# Patient Record
Sex: Female | Born: 1990 | Hispanic: Yes | Marital: Married | Smoking: Never smoker
Health system: Southern US, Community
[De-identification: ages and names within clinical notes are randomized; demographics above are authoritative.]

---

## 2020-05-10 ENCOUNTER — Emergency Department (HOSPITAL_COMMUNITY): Payer: Self-pay

## 2020-05-10 ENCOUNTER — Other Ambulatory Visit: Payer: Self-pay

## 2020-05-10 ENCOUNTER — Encounter (HOSPITAL_COMMUNITY): Payer: Self-pay | Admitting: Emergency Medicine

## 2020-05-10 ENCOUNTER — Emergency Department (HOSPITAL_COMMUNITY)
Admission: EM | Admit: 2020-05-10 | Discharge: 2020-05-10 | Discharge status: Against Medical Advice | Payer: Self-pay | Attending: Emergency Medicine | Admitting: Emergency Medicine

## 2020-05-10 DIAGNOSIS — Z5321 Procedure and treatment not carried out due to patient leaving prior to being seen by health care provider: Secondary | ICD-10-CM | POA: Insufficient documentation

## 2020-05-10 DIAGNOSIS — M25579 Pain in unspecified ankle and joints of unspecified foot: Secondary | ICD-10-CM | POA: Insufficient documentation

## 2020-05-10 NOTE — ED Triage Notes (Signed)
Pt c/o bilateral ankle pain after having a fall today after work pt states she miss some steps.

## 2020-05-10 NOTE — ED Notes (Signed)
Called for room X3 with no answer 

## 2020-05-10 NOTE — ED Notes (Signed)
Called x 2 no response

## 2020-12-28 ENCOUNTER — Ambulatory Visit: Payer: Self-pay | Admitting: Orthopaedic Surgery

## 2021-11-13 ENCOUNTER — Encounter (HOSPITAL_COMMUNITY): Payer: Self-pay | Admitting: Emergency Medicine

## 2021-11-13 ENCOUNTER — Other Ambulatory Visit: Payer: Self-pay

## 2021-11-13 ENCOUNTER — Emergency Department (HOSPITAL_COMMUNITY)
Admission: EM | Admit: 2021-11-13 | Discharge: 2021-11-14 | Disposition: A | Discharge status: Home / Self Care | Payer: Medicaid Other | Attending: Emergency Medicine | Admitting: Emergency Medicine

## 2021-11-13 DIAGNOSIS — R109 Unspecified abdominal pain: Secondary | ICD-10-CM | POA: Diagnosis present

## 2021-11-13 DIAGNOSIS — R350 Frequency of micturition: Secondary | ICD-10-CM | POA: Insufficient documentation

## 2021-11-13 DIAGNOSIS — N898 Other specified noninflammatory disorders of vagina: Secondary | ICD-10-CM | POA: Insufficient documentation

## 2021-11-13 DIAGNOSIS — R7309 Other abnormal glucose: Secondary | ICD-10-CM | POA: Diagnosis not present

## 2021-11-13 DIAGNOSIS — R102 Pelvic and perineal pain: Secondary | ICD-10-CM | POA: Diagnosis not present

## 2021-11-13 DIAGNOSIS — R591 Generalized enlarged lymph nodes: Secondary | ICD-10-CM

## 2021-11-13 DIAGNOSIS — R590 Localized enlarged lymph nodes: Secondary | ICD-10-CM | POA: Insufficient documentation

## 2021-11-13 LAB — URINALYSIS, ROUTINE W REFLEX MICROSCOPIC
Bilirubin Urine: NEGATIVE
Glucose, UA: NEGATIVE mg/dL
Ketones, ur: NEGATIVE mg/dL
Leukocytes,Ua: NEGATIVE
Nitrite: NEGATIVE
Protein, ur: NEGATIVE mg/dL
Specific Gravity, Urine: 1.013 (ref 1.005–1.030)
pH: 5 (ref 5.0–8.0)

## 2021-11-13 NOTE — ED Provider Notes (Signed)
Emergency Medicine Provider Triage Evaluation Note  Heidi Sloan , a 31 y.o. female  was evaluated in triage.  Pt complains of left lower abdominal pain.  She originally describes this is groin pain, however while asking clarifying questions it becomes apparent she is referring to her lower abdomen. She denies dysuria, frequency or urgency.  She did have 1 episode of abnormal discharge a week ago.  She does not think she is pregnant stating "I think I got my tubes burned."  Bowel movement was this morning and was normal.  No nausea or vomiting.  Pain worsens with ambulation.  Review of Systems  Positive: LLQ abdominal/pelvic pain Negative: Fevers  Physical Exam  BP 119/62    Pulse (!) 109    Temp 98.4 F (36.9 C) (Oral)    Resp 18    Ht 4\' 11"  (1.499 m)    Wt 125 kg    SpO2 95%    BMI 55.66 kg/m  Gen:   Awake, no distress   Resp:  Normal effort  MSK:   Moves extremities without difficulty  Other:  TTP LLQ.  No pain with rotation of left hip/thigh.    Medical Decision Making  Medically screening exam initiated at 11:22 PM.  Appropriate orders placed.  Maiden Coffey was informed that the remainder of the evaluation will be completed by another provider, this initial triage assessment does not replace that evaluation, and the importance of remaining in the ED until their evaluation is complete.  Will order abdominal labs, hcg, urine.    Lorin Glass, PA-C 11/13/21 2324    Merryl Hacker, MD 11/14/21 [id number]

## 2021-11-13 NOTE — ED Triage Notes (Signed)
Patient reports left groin pain worse when walking onset last week . Denies injury / ambulatory .

## 2021-11-14 ENCOUNTER — Emergency Department (HOSPITAL_COMMUNITY): Payer: Medicaid Other

## 2021-11-14 LAB — CBC WITH DIFFERENTIAL/PLATELET
Abs Immature Granulocytes: 0.03 10*3/uL (ref 0.00–0.07)
Basophils Absolute: 0.1 10*3/uL (ref 0.0–0.1)
Basophils Relative: 1 %
Eosinophils Absolute: 0.4 10*3/uL (ref 0.0–0.5)
Eosinophils Relative: 4 %
HCT: 38.4 % (ref 36.0–46.0)
Hemoglobin: 13 g/dL (ref 12.0–15.0)
Immature Granulocytes: 0 %
Lymphocytes Relative: 23 %
Lymphs Abs: 2.2 10*3/uL (ref 0.7–4.0)
MCH: 30.8 pg (ref 26.0–34.0)
MCHC: 33.9 g/dL (ref 30.0–36.0)
MCV: 91 fL (ref 80.0–100.0)
Monocytes Absolute: 0.7 10*3/uL (ref 0.1–1.0)
Monocytes Relative: 7 %
Neutro Abs: 6.3 10*3/uL (ref 1.7–7.7)
Neutrophils Relative %: 65 %
Platelets: 301 10*3/uL (ref 150–400)
RBC: 4.22 MIL/uL (ref 3.87–5.11)
RDW: 12.1 % (ref 11.5–15.5)
WBC: 9.8 10*3/uL (ref 4.0–10.5)
nRBC: 0 % (ref 0.0–0.2)

## 2021-11-14 LAB — COMPREHENSIVE METABOLIC PANEL
ALT: 28 U/L (ref 0–44)
AST: 21 U/L (ref 15–41)
Albumin: 3.7 g/dL (ref 3.5–5.0)
Alkaline Phosphatase: 64 U/L (ref 38–126)
Anion gap: 9 (ref 5–15)
BUN: 10 mg/dL (ref 6–20)
CO2: 25 mmol/L (ref 22–32)
Calcium: 9.5 mg/dL (ref 8.9–10.3)
Chloride: 103 mmol/L (ref 98–111)
Creatinine, Ser: 0.76 mg/dL (ref 0.44–1.00)
GFR, Estimated: >60 mL/min (ref >60)
Glucose, Bld: 110 mg/dL — ABNORMAL HIGH (ref 70–99)
Potassium: 3.6 mmol/L (ref 3.5–5.1)
Sodium: 137 mmol/L (ref 135–145)
Total Bilirubin: 0.7 mg/dL (ref 0.3–1.2)
Total Protein: 7.5 g/dL (ref 6.5–8.1)

## 2021-11-14 LAB — WET PREP, GENITAL
Clue Cells Wet Prep HPF POC: NONE SEEN
Sperm: NONE SEEN
Trich, Wet Prep: NONE SEEN
WBC, Wet Prep HPF POC: >=10 — AB (ref <10)
Yeast Wet Prep HPF POC: NONE SEEN

## 2021-11-14 LAB — I-STAT BETA HCG BLOOD, ED (MC, WL, AP ONLY): I-stat hCG, quantitative: <5 m[IU]/mL (ref <5)

## 2021-11-14 LAB — HIV ANTIBODY (ROUTINE TESTING W REFLEX): HIV Screen 4th Generation wRfx: NONREACTIVE

## 2021-11-14 LAB — LIPASE, BLOOD: Lipase: 28 U/L (ref 11–51)

## 2021-11-14 MED ORDER — IOHEXOL 300 MG/ML  SOLN
100.0000 mL | Freq: Once | INTRAMUSCULAR | Status: AC | PRN
  Administered 2021-11-14: 100 mL via INTRAVENOUS

## 2021-11-14 NOTE — ED Provider Notes (Signed)
[facility] EMERGENCY DEPARTMENT Provider Note   CSN: [id number] Arrival date & time: 11/13/21  2228     History  Chief Complaint  Patient presents with   Groin Pain     Heidi Sloan is a 31 y.o. G4P4 otherwise healthy female presents emergency department for evaluation of left groin pain gradually worsening for the past week.  She reports it is more of a pressure-like feeling and only occurs with movement.  She denies any dysuria, hematuria, dyspareunia, polyuria.  She does report some frequency over the past few days with no urgency.  She reports she had an episode of mucousy discharge that she describes to be a mucous plug when the symptoms onset.  LMP June 2022, the patient reports her periods have been irregular since her bilateral tubal ligation in 2018.  She denies any fevers, pain anywhere else in her body, leg pain, or abnormal vaginal bleeding. Denies any overlying skin changes. Surgical history includes bilateral tubal ligation, cesarean sections x4, and cholecystectomy.  She denies any daily medications.  No known drug allergies.  Denies any tobacco, EtOH, or drug use ever.  HPI     Home Medications Prior to Admission medications   Not on File      Allergies    Patient has no known allergies.    Review of Systems   Review of Systems  Constitutional:  Negative for chills and fever.  Gastrointestinal:  Negative for abdominal pain, constipation, diarrhea, nausea and vomiting.  Genitourinary:  Positive for frequency, pelvic pain and vaginal discharge. Negative for decreased urine volume, dyspareunia, dysuria, genital sores, hematuria, urgency and vaginal bleeding.  Skin:  Negative for rash and wound.  All other systems reviewed and are negative.  Physical Exam Updated Vital Signs BP 99/60    Pulse 86    Temp 98.4 F (36.9 C) (Oral)    Resp 20    Ht 4\' 11"  (1.499 m)    Wt 125 kg    SpO2 98%    BMI 55.66 kg/m  Physical Exam Vitals and nursing note  reviewed. Exam conducted with a chaperone present (Martinique Boney, RN).  Constitutional:      General: She is not in acute distress.    Appearance: She is obese. She is not toxic-appearing.  HENT:     Head: Normocephalic and atraumatic.  Eyes:     General: No scleral icterus. Cardiovascular:     Rate and Rhythm: Normal rate and regular rhythm.  Pulmonary:     Effort: Pulmonary effort is normal. No respiratory distress.     Breath sounds: Normal breath sounds.  Abdominal:     General: Bowel sounds are normal.     Palpations: Abdomen is soft.     Tenderness: There is no abdominal tenderness. There is no guarding or rebound.     Comments: Abdominal exam limited secondary to body habitus.  Normal active bowel sounds.  No abdominal tenderness to palpation.  Genitourinary:    General: Normal vulva.     Exam position: Lithotomy position.     Pubic Area: No rash.      Labia:        Right: No rash, tenderness, lesion or injury.        Left: No rash, tenderness, lesion or injury.      Vagina: Normal. No foreign body. No erythema, tenderness, bleeding or lesions.     Cervix: No cervical motion tenderness, friability, erythema or cervical bleeding.     Comments:  Tenderness to the left side of her mons pubis into the left inguinal crease.  Slight fullness noted, however no overlying skin changes or red streaking. No palpable hernia.  No CMT tenderness. No friability, erythema, or bleeding noted to the cervix.  Bimanual exam deferred.  Musculoskeletal:        General: No deformity.     Cervical back: Normal range of motion.  Skin:    General: Skin is warm and dry.  Neurological:     General: No focal deficit present.     Mental Status: She is alert. Mental status is at baseline.    ED Results / Procedures / Treatments   Labs (all labs ordered are listed, but only abnormal results are displayed) Labs Reviewed  COMPREHENSIVE METABOLIC PANEL - Abnormal; Notable for the following  components:      Result Value   Glucose, Bld 110 (*)    All other components within normal limits  URINALYSIS, ROUTINE W REFLEX MICROSCOPIC - Abnormal; Notable for the following components:   Hgb urine dipstick SMALL (*)    Bacteria, UA RARE (*)    All other components within normal limits  WET PREP, GENITAL  LIPASE, BLOOD  CBC WITH DIFFERENTIAL/PLATELET  I-STAT BETA HCG BLOOD, ED ([facility], [facility], [facility] ONLY)  GC/CHLAMYDIA PROBE AMP ([facility]) NOT AT [facility]    EKG None  Radiology CT Abdomen Pelvis W Contrast  Result Date: 11/14/2021 CLINICAL DATA:  Left lower quadrant abdominal pain. Left groin pain worse when walking. EXAM: CT ABDOMEN AND PELVIS WITH CONTRAST TECHNIQUE: Multidetector CT imaging of the abdomen and pelvis was performed using the standard protocol following bolus administration of intravenous contrast. CONTRAST:  161mL OMNIPAQUE IOHEXOL 300 MG/ML  SOLN COMPARISON:  None. FINDINGS: Lower chest: Chest unremarkable. Hepatobiliary: No suspicious focal abnormality within the liver parenchyma. Nonvisualization of the gallbladder may be related to underdistention or cholecystectomy. No intrahepatic or extrahepatic biliary dilation. Pancreas: No focal mass lesion. No dilatation of the main duct. No intraparenchymal cyst. No peripancreatic edema. Spleen: No splenomegaly. No focal mass lesion. Adrenals/Urinary Tract: No adrenal nodule or mass. Kidneys unremarkable. No evidence for hydroureter. The urinary bladder appears normal for the degree of distention. Stomach/Bowel: Stomach is unremarkable. No gastric wall thickening. No evidence of outlet obstruction. Duodenum is normally positioned as is the ligament of Treitz. No small bowel wall thickening. No small bowel dilatation. The terminal ileum is normal. The appendix is not well visualized, but there is no edema or inflammation in the region of the cecum. No gross colonic mass. No colonic wall thickening. Vascular/Lymphatic: No abdominal aortic  aneurysm. There is no gastrohepatic or hepatoduodenal ligament lymphadenopathy. No retroperitoneal or mesenteric lymphadenopathy. Prominent 9 mm short axis left external iliac node identified on image 78/3. There is lymphadenopathy in the left groin measuring up to 1.5 cm short axis on 87/3 and 1.6 cm short axis on 92/3. Reproductive: The uterus is unremarkable.  There is no adnexal mass. Other: No intraperitoneal free fluid. Musculoskeletal: No worrisome lytic or sclerotic osseous abnormality. No evidence for ventral abdominal wall or groin hernia. IMPRESSION: 1. Mild lymphadenopathy in the left groin. While this is likely reactive, lymphoproliferative etiology cannot be excluded. Close follow-up recommended. 2. Otherwise unremarkable exam. Electronically Signed   By: Misty Stanley M.D.   On: 11/14/2021 06:07    Procedures Procedures    Medications Ordered in ED Medications  iohexol (OMNIPAQUE) 300 MG/ML solution 100 mL (100 mLs Intravenous Contrast Given 11/14/21 0533)    ED  Course/ Medical Decision Making/ A&P                           Medical Decision Making  31 year old female presents the emergency department for evaluation of left-sided groin pain gradually worsening for the past week.  Differential diagnosis includes was not limited to STD, lymphadenopathy, diverticulitis, diverticulosis, constipation, ovarian cyst.  Vital signs show normotensive, afebrile, normal heart rate, satting 99% on room air.  Physical exam shows tenderness over the left side of the mons pubis, however no palpable lymphadenopathy.  No CMT or friable cervix.  Overlying skin changes or red streaking noted. Labs and imaging ordered in triage.  MP shows mildly elevated glucose at 110, however no electrolyte abnormality.  CBC shows no signs of leukocytosis or anemia.  Lipase negative.  hCG negative.  Urinalysis shows small amount of blood with rare bacteria however no signs of UTI.  Wet prep shows greater than 10 white blood  cell count, but no clue or trichomoniasis seen.  GC, RPR, HIV pending.  CT abdomen shows mild lymphadenopathy of the left groin, otherwise unremarkable exam.  CT says lymphoproliferative etiology cannot be excluded, but looking at her CBC, she has a normal white blood cell count with no abnormal cells seen.  With stable vital signs, nonacute abdominal CT, and otherwise reassuring labs, patient safe for discharge.  Had shared decision-making with patient on prophylactic treatment of STDs.  The patient would like to hold off at this time and wait for lab results to return before getting treatment.  Advised the patient to follow-up with her MyChart to check for her RPR, gonorrhea, and Chlamydia results.  I discussed with her that she will need to follow-up with an GYN and PCP for this problem and reevaluation.  The patient reports she does not have a gynecologist or PCP, will refer her to one.  Strict return precautions given.  Patient agrees to plan.  Patient is stable being discharged home in good condition.  I discussed this case with my attending physician who cosigned this note including patient's presenting symptoms, physical exam, and planned diagnostics and interventions. Attending physician stated agreement with plan or made changes to plan which were implemented.   Final Clinical Impression(s) / ED Diagnoses Final diagnoses:  Lymphadenopathy    Rx / DC Orders ED Discharge Orders     None         Sherrell Puller, PA-C 11/14/21 1501    Godfrey Pick, MD 11/17/21 1735

## 2021-11-14 NOTE — Discharge Instructions (Signed)
You were seen in today for evaluation of your left-sided abdominal pain.  Your lab work was normal.  No signs of a UTI.  Your CT scan showed some lymphadenopathy in the left where you are experiencing pain.  You still have labs pending.  Please check with your MyChart for evaluation of these.  If a prescription is needed, someone will call you in one.  If you have any redness over the area, red streaking, or increased swelling, please return to the nearest emergency department for reevaluation.

## 2021-11-15 LAB — RPR: RPR Ser Ql: NONREACTIVE

## 2021-11-15 LAB — GC/CHLAMYDIA PROBE AMP (~~LOC~~) NOT AT ARMC
Chlamydia: NEGATIVE
Comment: NEGATIVE
Comment: NORMAL
Neisseria Gonorrhea: NEGATIVE

## 2021-12-14 ENCOUNTER — Encounter (HOSPITAL_COMMUNITY): Payer: Self-pay | Admitting: Radiology

## 2022-01-01 IMAGING — CR DG ANKLE COMPLETE 3+V*L*
3 series · 3 of 3 positions shown · non-contrast
Comparison: None.

CLINICAL DATA: Fell, ankle pain

EXAM:
LEFT ANKLE COMPLETE - 3+ VIEW

[ankle ap]
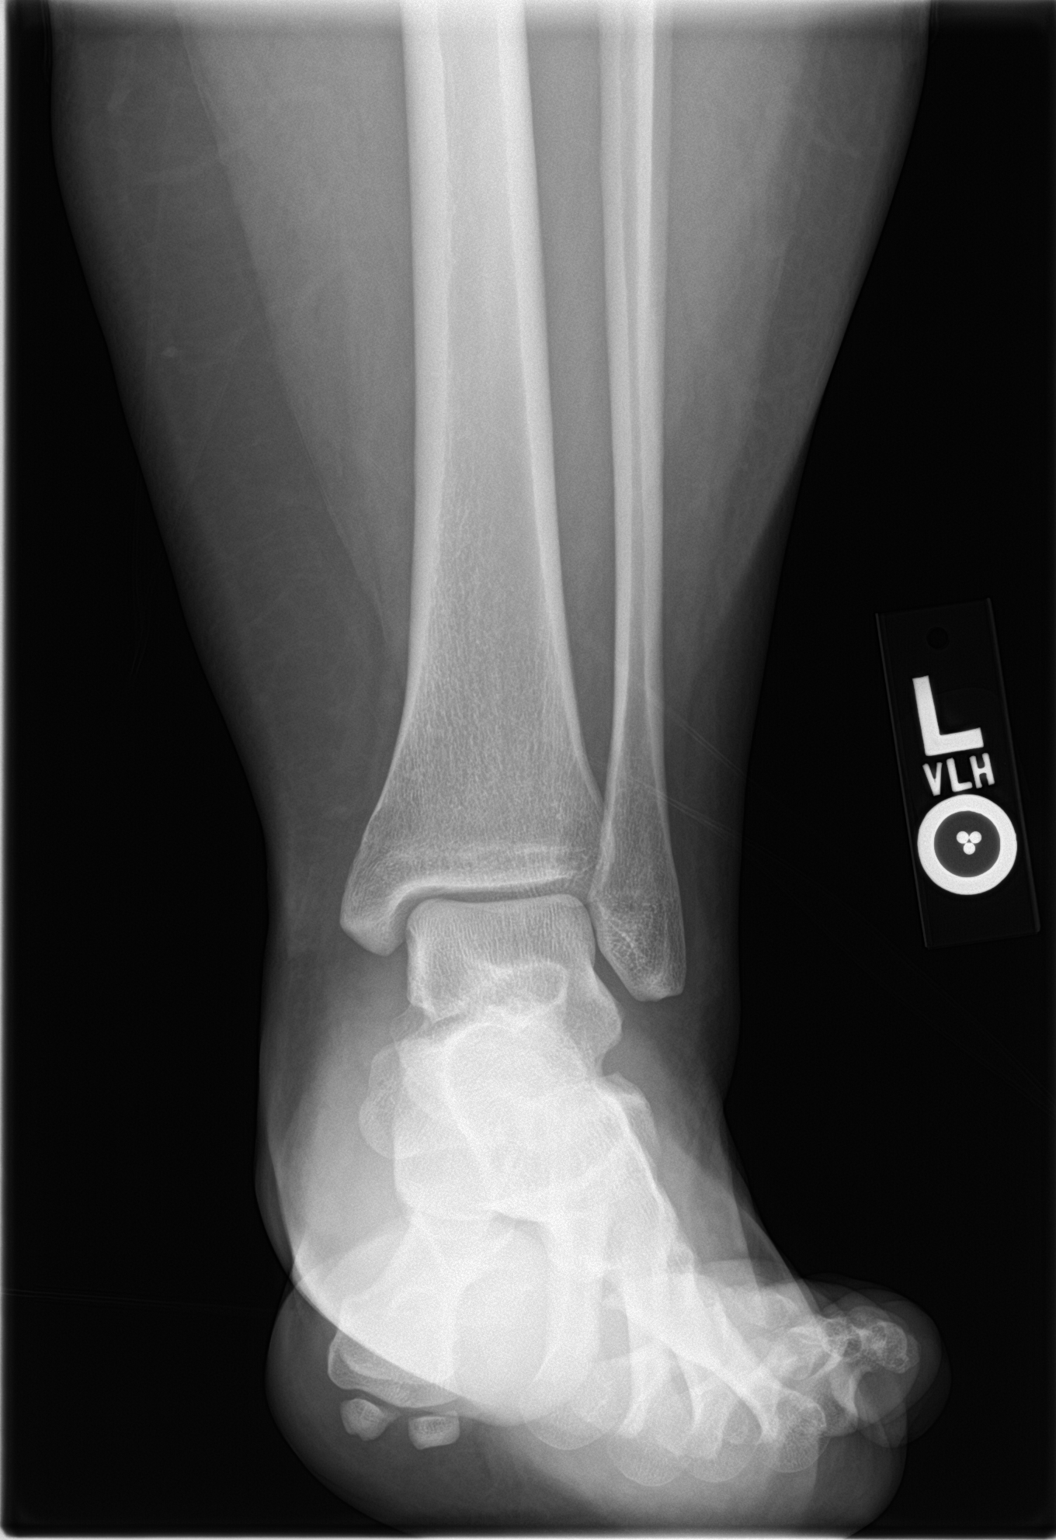

[ankle obl]
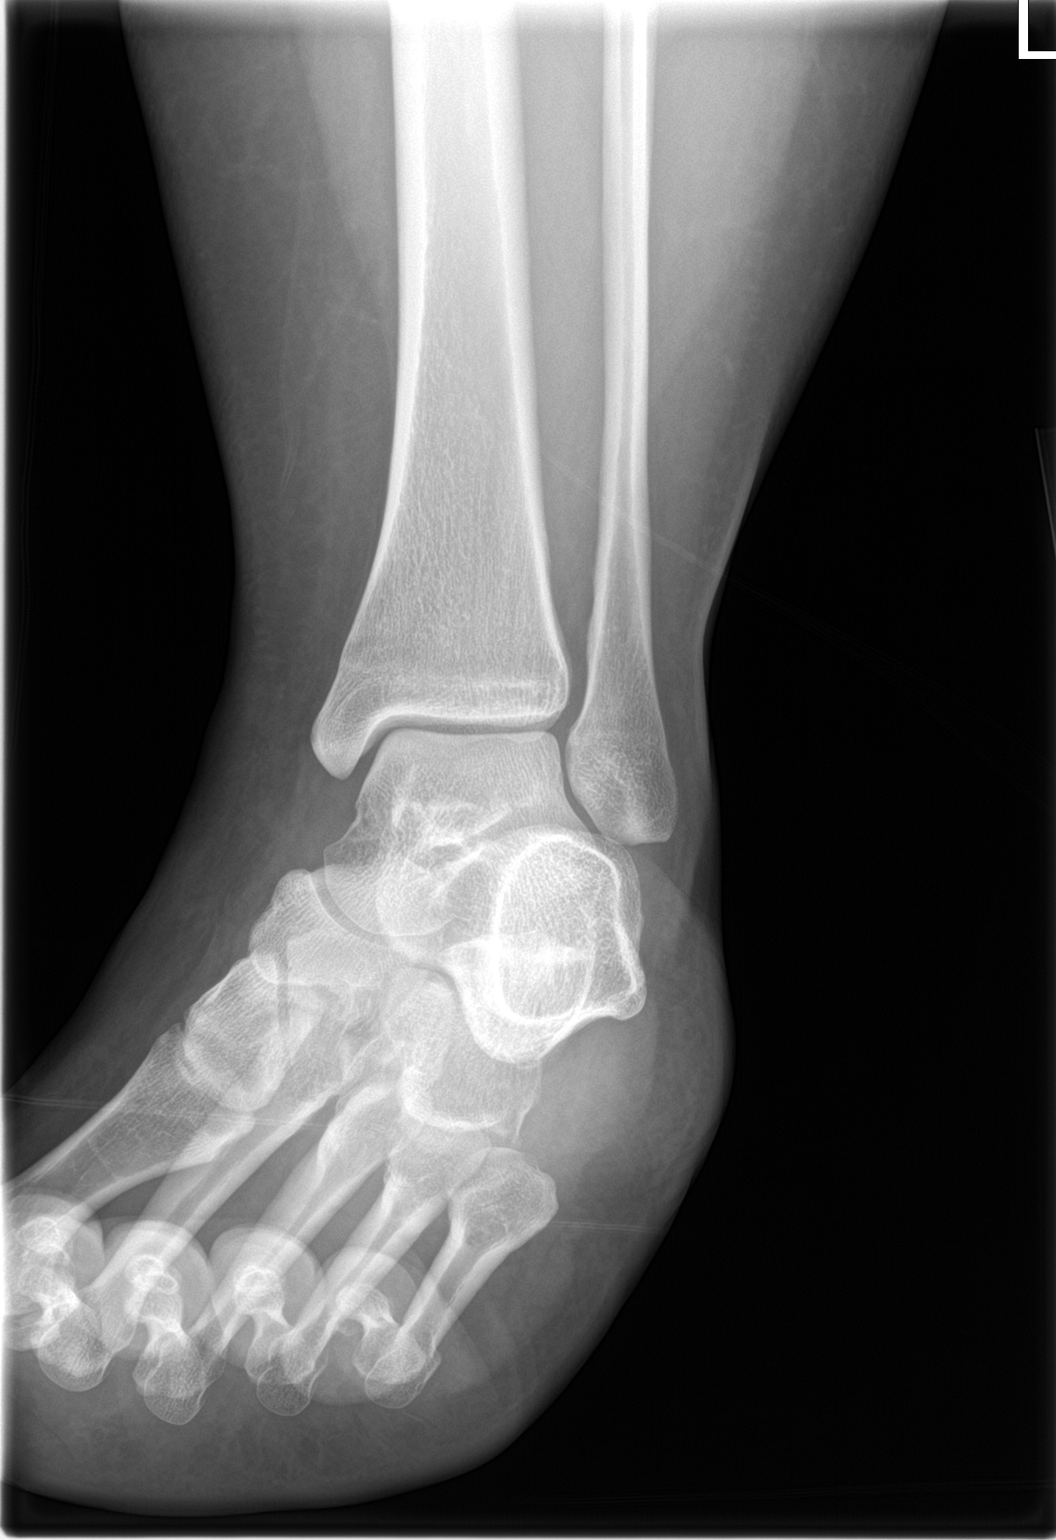

[ankle lat]
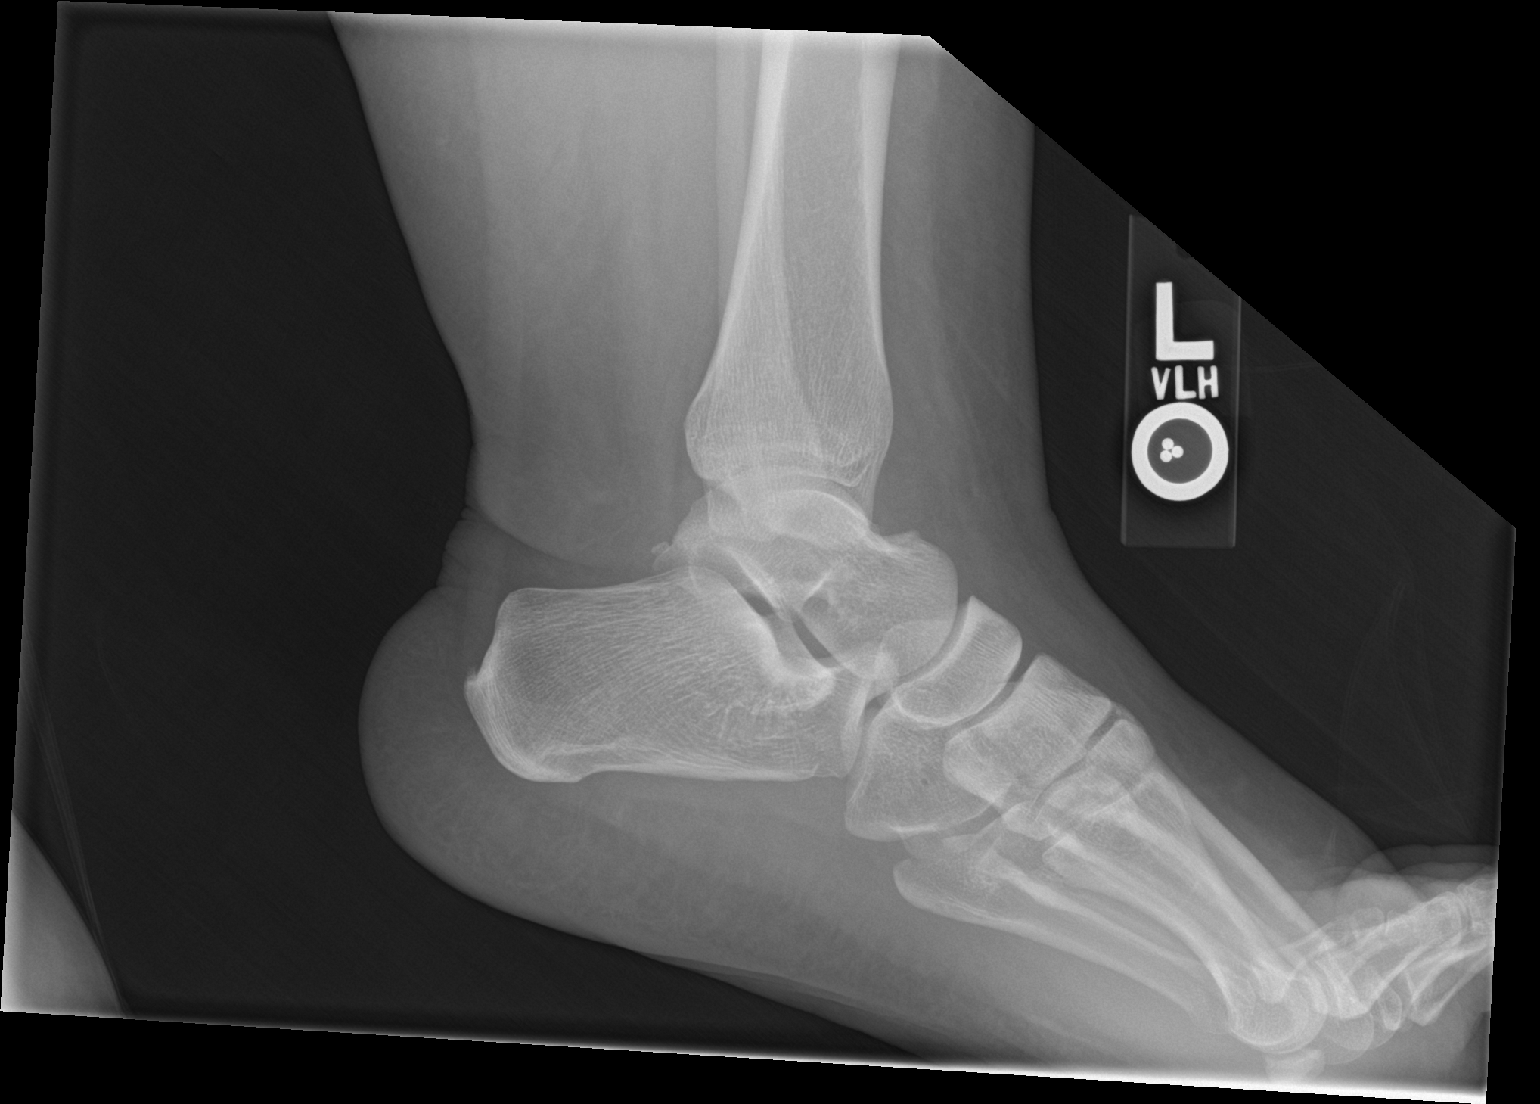

[3 of 3 positions shown; findings below may reference images not displayed]

FINDINGS: Frontal, oblique, and lateral views of the left ankle are obtained.
No fracture, subluxation, or dislocation. Joint spaces are well
preserved. Soft tissues are normal.
IMPRESSION: 1. Unremarkable left ankle.

## 2023-07-08 IMAGING — CT CT ABD-PELV W/ CM
2 of 4 series · 15 of 46 positions shown, 17 images · IV contrast (omnipaque)
Comparison: None.

CLINICAL DATA: Left lower quadrant abdominal pain. Left groin pain
worse when walking.

EXAM:
CT ABDOMEN AND PELVIS WITH CONTRAST
TECHNIQUE: Multidetector CT imaging of the abdomen and pelvis was performed
using the standard protocol following bolus administration of
intravenous contrast.
CONTRAST:  100mL OMNIPAQUE IOHEXOL 300 MG/ML  SOLN

[Series 3: a/p w/ 5mm · axial · 0.98mm/px · z∈[+795,+1240]mm · 12 of 99 slices shown, 14 images]
[im 5/99  soft-tissue]
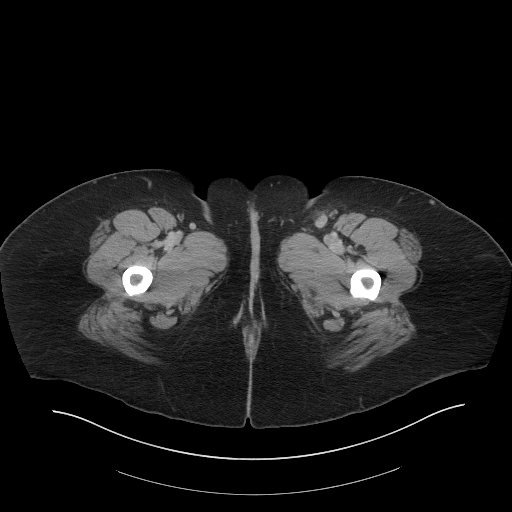
[im 5/99  bone]
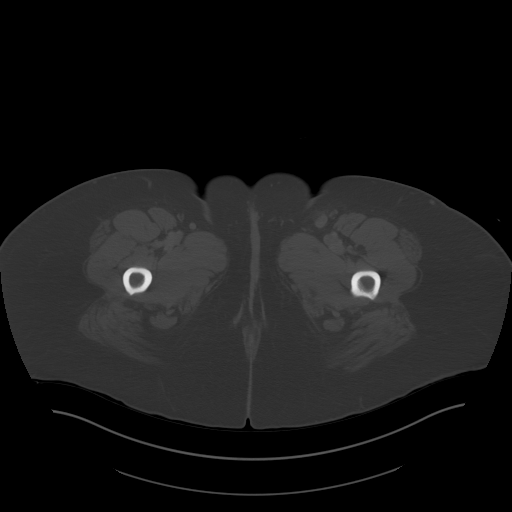
[im 15/99  soft-tissue]
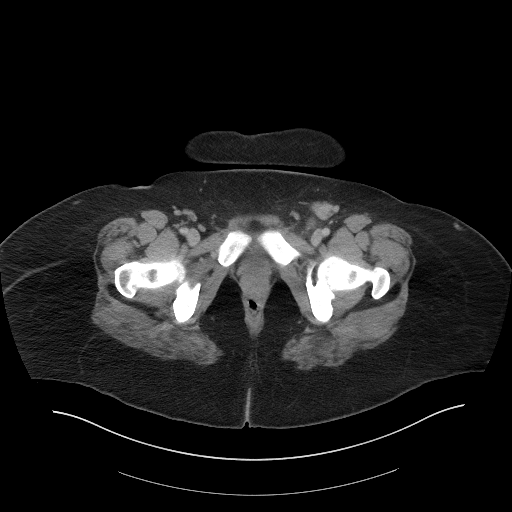
[im 24/99  soft-tissue]
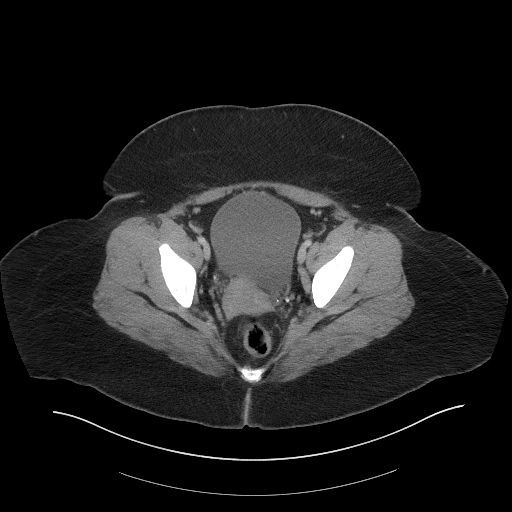
[im 29/99  soft-tissue]
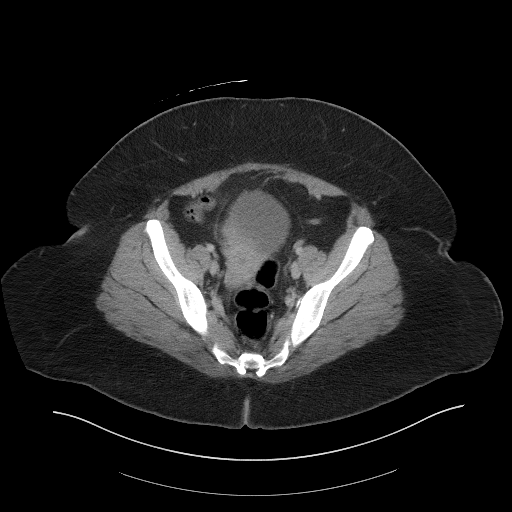
[im 38/99  soft-tissue]
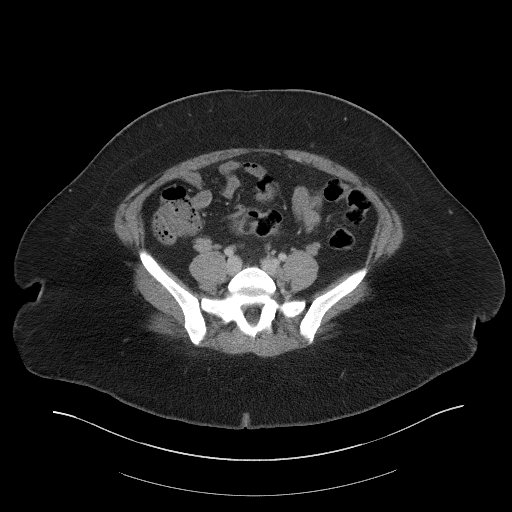
[im 47/99  soft-tissue]
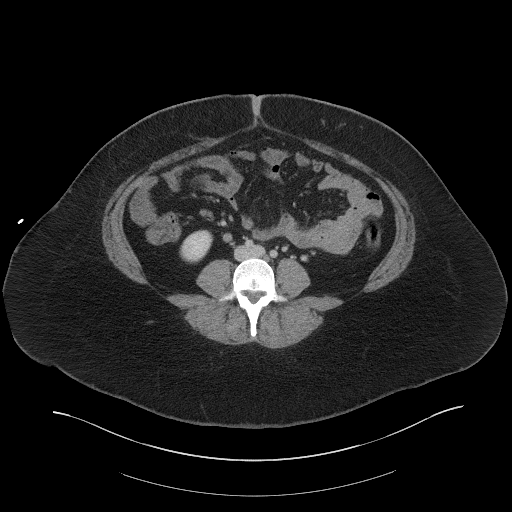
[im 52/99  soft-tissue]
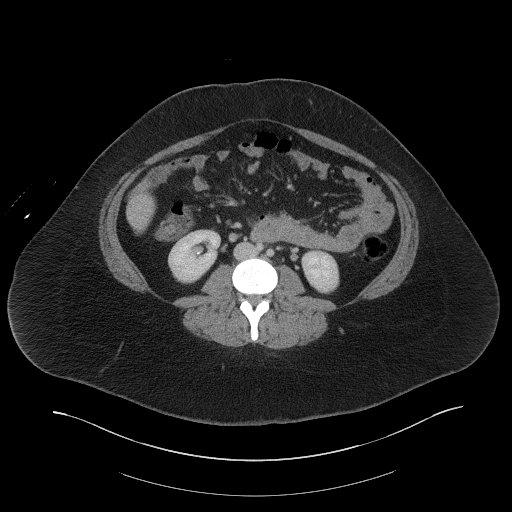
[im 61/99  soft-tissue]
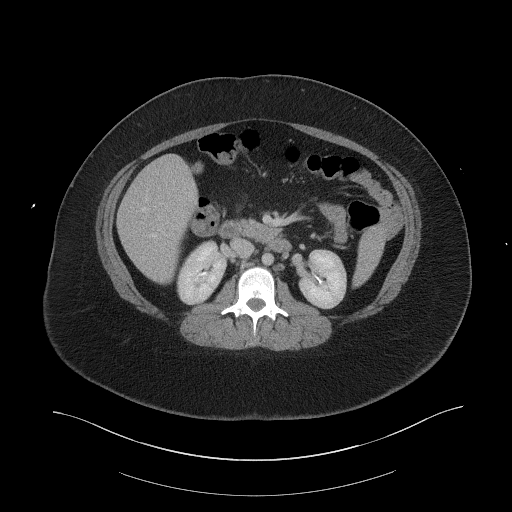
[im 71/99  soft-tissue]
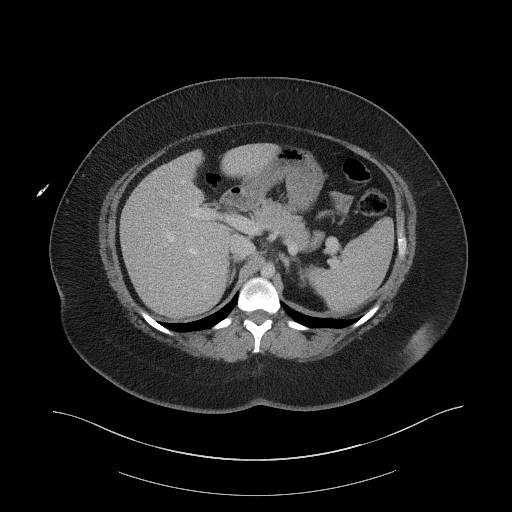
[im 71/99  bone]
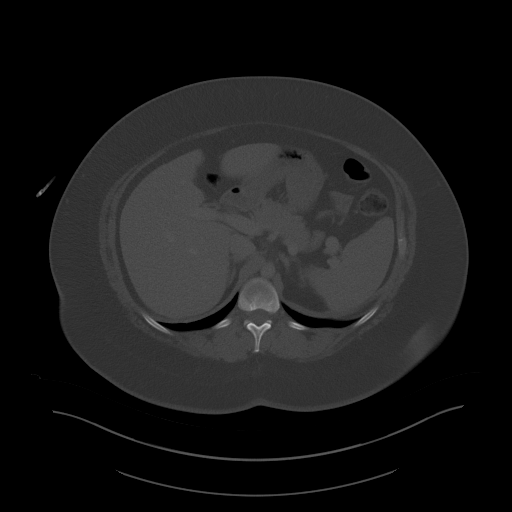
[im 75/99  soft-tissue]
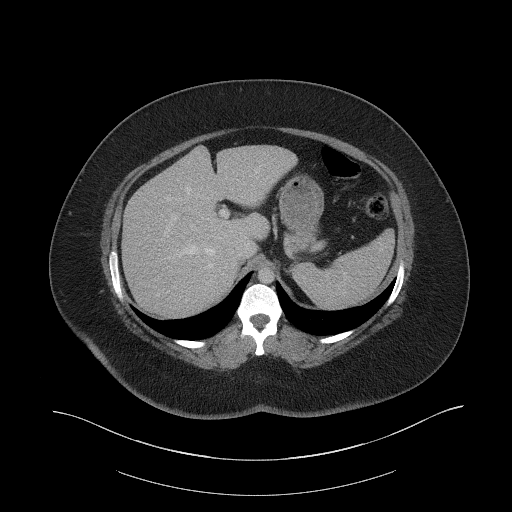
[im 85/99  soft-tissue]
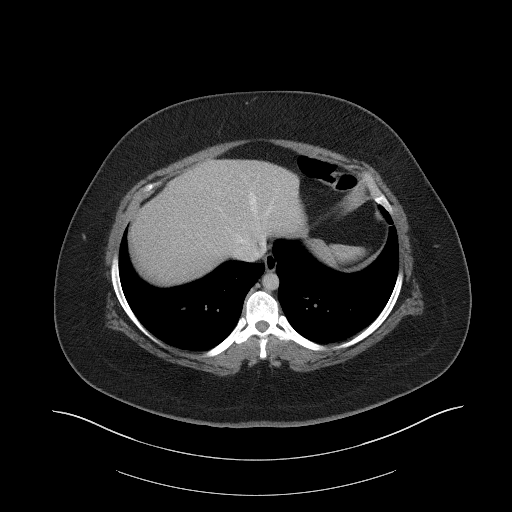
[im 94/99  soft-tissue]
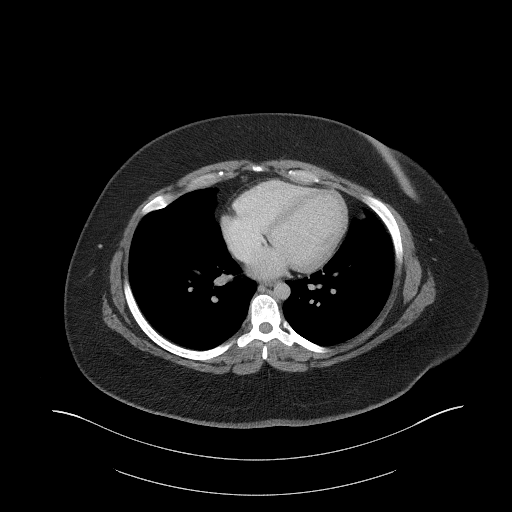

[Series 6: a/p w/ cor · coronal · 0.97mm/px · 3 of 187 slices shown]
[im 63/187  soft-tissue]
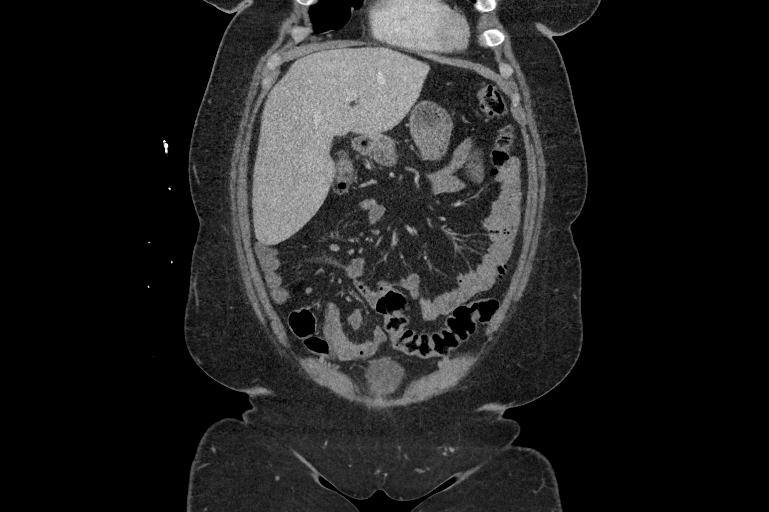
[im 83/187  soft-tissue]
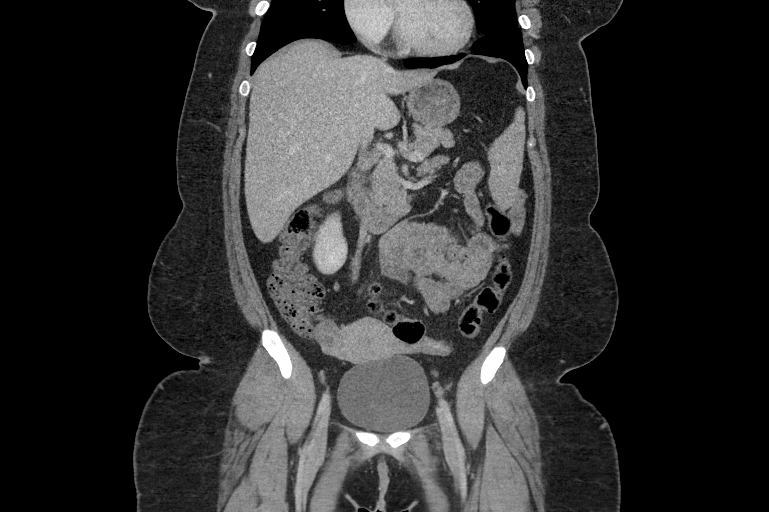
[im 104/187  soft-tissue]
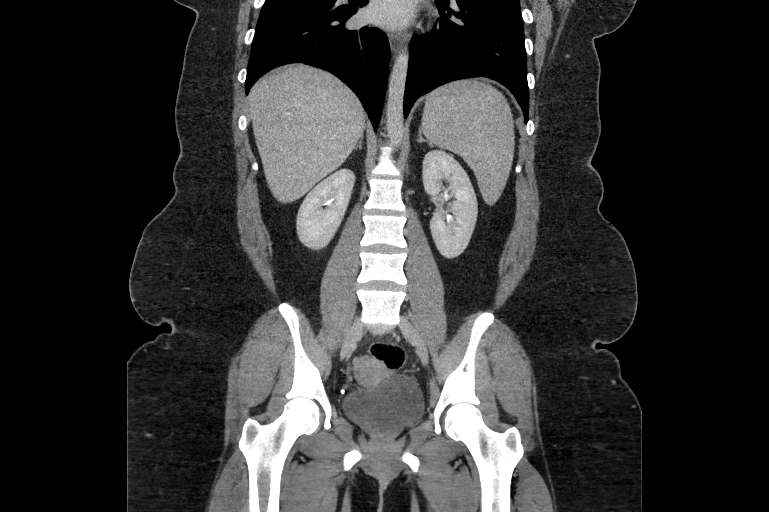

[15 of 46 positions shown; findings below may reference images not displayed]

FINDINGS: Lower chest: Chest unremarkable.

Hepatobiliary: No suspicious focal abnormality within the liver
parenchyma. Nonvisualization of the gallbladder may be related to
underdistention or cholecystectomy. No intrahepatic or extrahepatic
biliary dilation.

Pancreas: No focal mass lesion. No dilatation of the main duct. No
intraparenchymal cyst. No peripancreatic edema.

Spleen: No splenomegaly. No focal mass lesion.

Adrenals/Urinary Tract: No adrenal nodule or mass. Kidneys
unremarkable. No evidence for hydroureter. The urinary bladder
appears normal for the degree of distention.

Stomach/Bowel: Stomach is unremarkable. No gastric wall thickening.
No evidence of outlet obstruction. Duodenum is normally positioned
as is the ligament of Treitz. No small bowel wall thickening. No
small bowel dilatation. The terminal ileum is normal. The appendix
is not well visualized, but there is no edema or inflammation in the
region of the cecum. No gross colonic mass. No colonic wall
thickening.

Vascular/Lymphatic: No abdominal aortic aneurysm. There is no
gastrohepatic or hepatoduodenal ligament lymphadenopathy. No
retroperitoneal or mesenteric lymphadenopathy. Prominent 9 mm short
axis left external iliac node identified on image 78/3. There is
lymphadenopathy in the left groin measuring up to 1.5 cm short axis
on 87/3 and 1.6 cm short axis on 92/3.

Reproductive: The uterus is unremarkable.  There is no adnexal mass.

Other: No intraperitoneal free fluid.

Musculoskeletal: No worrisome lytic or sclerotic osseous
abnormality. No evidence for ventral abdominal wall or groin hernia.
IMPRESSION: 1. Mild lymphadenopathy in the left groin. While this is likely
reactive, lymphoproliferative etiology cannot be excluded. Close
follow-up recommended.
2. Otherwise unremarkable exam.
